# Patient Record
Sex: Female | Born: 1980 | Hispanic: No | Marital: Married | Smoking: Never smoker
Health system: Southern US, Community
[De-identification: ages and names within clinical notes are randomized; demographics above are authoritative.]

## PROBLEM LIST (undated history)

## (undated) DIAGNOSIS — Z8619 Personal history of other infectious and parasitic diseases: Secondary | ICD-10-CM

## (undated) DIAGNOSIS — Z789 Other specified health status: Secondary | ICD-10-CM

## (undated) HISTORY — DX: Personal history of other infectious and parasitic diseases: Z86.19

## (undated) HISTORY — DX: Other specified health status: Z78.9

## (undated) HISTORY — PX: NO PAST SURGERIES: SHX2092

---

## 2010-01-18 NOTE — L&D Delivery Note (Signed)
Delivery Note At 5:58 AM a viable female was delivered via Vaginal, Spontaneous Delivery (Presentation: ; Occiput Anterior).  APGAR:9 ,9 ; weight .   Placenta status: Intact, Spontaneous.  Cord: 3 vessels with the following complications: None.  Cord pH: NA  Anesthesia: Epidural  Episiotomy: None Lacerations: 2nd degree;Perineal Suture Repair: 3.0 vicryl Est. Blood Loss (mL): 300 ml  Mom to postpartum.  Baby to [facility] J. 12/23/2010, 6:11 AM

## 2010-05-26 LAB — ANTIBODY SCREEN: Antibody Screen: NEGATIVE

## 2010-05-26 LAB — GC/CHLAMYDIA PROBE AMP, GENITAL: Chlamydia: NEGATIVE

## 2010-05-26 LAB — RPR: RPR: NONREACTIVE

## 2010-05-26 LAB — RUBELLA ANTIBODY, IGM: Rubella: IMMUNE

## 2010-09-19 ENCOUNTER — Inpatient Hospital Stay (HOSPITAL_COMMUNITY): Admission: AD | Admit: 2010-09-19 | Payer: Self-pay | Source: Ambulatory Visit | Admitting: Obstetrics and Gynecology

## 2010-12-18 ENCOUNTER — Encounter (HOSPITAL_COMMUNITY): Payer: Self-pay | Admitting: *Deleted

## 2010-12-18 ENCOUNTER — Telehealth (HOSPITAL_COMMUNITY): Payer: Self-pay | Admitting: *Deleted

## 2010-12-18 NOTE — Telephone Encounter (Signed)
Preadmission screen  

## 2010-12-22 ENCOUNTER — Inpatient Hospital Stay (HOSPITAL_COMMUNITY)
Admission: RE | Admit: 2010-12-22 | Discharge: 2010-12-25 | DRG: 373 | Disposition: A | Discharge status: Home / Self Care | Payer: BC Managed Care – PPO | Source: Ambulatory Visit | Attending: Obstetrics and Gynecology | Admitting: Obstetrics and Gynecology

## 2010-12-22 ENCOUNTER — Other Ambulatory Visit (HOSPITAL_COMMUNITY): Payer: Self-pay | Admitting: Obstetrics and Gynecology

## 2010-12-22 ENCOUNTER — Inpatient Hospital Stay (HOSPITAL_COMMUNITY): Payer: BC Managed Care – PPO

## 2010-12-22 ENCOUNTER — Encounter (HOSPITAL_COMMUNITY): Payer: Self-pay | Admitting: Obstetrics and Gynecology

## 2010-12-22 LAB — CBC
HCT: 37 % (ref 36.0–46.0)
Hemoglobin: 12.2 g/dL (ref 12.0–15.0)
MCH: 29 pg (ref 26.0–34.0)
MCHC: 33 g/dL (ref 30.0–36.0)
MCV: 88.1 fL (ref 78.0–100.0)
RBC: 4.2 MIL/uL (ref 3.87–5.11)

## 2010-12-22 MED ORDER — LIDOCAINE HCL (PF) 1 % IJ SOLN: 30.0000 mL | INTRAMUSCULAR | Status: DC | PRN

## 2010-12-22 MED ORDER — OXYTOCIN 20 UNITS IN LACTATED RINGERS INFUSION - SIMPLE: 125.0000 mL/h | Freq: Once | INTRAVENOUS | Status: DC

## 2010-12-22 MED ORDER — OXYCODONE-ACETAMINOPHEN 5-325 MG PO TABS: 2.0000 | ORAL_TABLET | ORAL | Status: DC | PRN

## 2010-12-22 MED ORDER — OXYTOCIN 20 UNITS IN LACTATED RINGERS INFUSION - SIMPLE
125.0000 mL/h | INTRAVENOUS | Status: DC
  Administered 2010-12-22: 2 mL/h via INTRAVENOUS
  Filled 2010-12-22: qty 1000

## 2010-12-22 MED ORDER — ACETAMINOPHEN 325 MG PO TABS: 650.0000 mg | ORAL_TABLET | ORAL | Status: DC | PRN

## 2010-12-22 MED ORDER — CITRIC ACID-SODIUM CITRATE 334-500 MG/5ML PO SOLN: 30.0000 mL | ORAL | Status: DC | PRN

## 2010-12-22 MED ORDER — BUTORPHANOL TARTRATE 2 MG/ML IJ SOLN: 1.0000 mg | INTRAMUSCULAR | Status: DC | PRN

## 2010-12-22 MED ORDER — LACTATED RINGERS IV SOLN: INTRAVENOUS | Status: DC

## 2010-12-22 MED ORDER — IBUPROFEN 600 MG PO TABS
600.0000 mg | ORAL_TABLET | Freq: Four times a day (QID) | ORAL | Status: DC | PRN
  Filled 2010-12-22: qty 1

## 2010-12-22 MED ORDER — ONDANSETRON HCL 4 MG/2ML IJ SOLN: 4.0000 mg | Freq: Four times a day (QID) | INTRAMUSCULAR | Status: DC | PRN

## 2010-12-22 MED ORDER — ZOLPIDEM TARTRATE 10 MG PO TABS: 10.0000 mg | ORAL_TABLET | Freq: Every evening | ORAL | Status: DC | PRN

## 2010-12-22 MED ORDER — FLEET ENEMA 7-19 GM/118ML RE ENEM: 1.0000 | ENEMA | RECTAL | Status: DC | PRN

## 2010-12-22 MED ORDER — LACTATED RINGERS IV SOLN: 500.0000 mL | INTRAVENOUS | Status: DC | PRN

## 2010-12-22 MED ORDER — TERBUTALINE SULFATE 1 MG/ML IJ SOLN: 0.2500 mg | Freq: Once | INTRAMUSCULAR | Status: AC | PRN

## 2010-12-22 MED ORDER — OXYTOCIN BOLUS FROM INFUSION
500.0000 mL | Freq: Once | INTRAVENOUS | Status: DC
  Filled 2010-12-22: qty 500

## 2010-12-22 NOTE — Progress Notes (Signed)
Pt taken down to [place] by RN

## 2010-12-22 NOTE — H&P (Signed)
Joanne Scott is an 30 y.o. female.at 39 weeks for elective induction.  Prenatal course has been uncomplicated.  Prior pregnancy complicated by 25 week abruption and normal vagina deliver of 1 lb 13 oz female.  Two subsequent pregnancies were delivered at term following induction.  The patient has requested induction again and understands risks, possible complications including possible Cesarean delivery.  Informed consent has been given.  Pertinent Gynecological History: Menses: no Bleeding: no Contraception: none DES exposure: denies Blood transfusions: none Sexually transmitted diseases: no past history Previous GYN Procedures: no  Last mammogram: na Date: na Last pap: normal Date: 04/27/10 OB History: G5, P2 ,PT 1, A1, L 3   Menstrual History: Menarche age: Unknown No LMP recorded. Patient is pregnant.    Past Medical History  Diagnosis Date  . History of chicken pox   . No pertinent past medical history     Past Surgical History  Procedure Date  . No past surgeries     Family History  Problem Relation Age of Onset  . Hypertension Mother   . Heart disease Father   . Anesthesia problems Neg Hx   . Hypotension Neg Hx   . Malignant hyperthermia Neg Hx   . Pseudochol deficiency Neg Hx     Social History:  reports that she has never smoked. She has never used smokeless tobacco. She reports that she does not drink alcohol or use illicit drugs.  Allergies: No Known Allergies   (Not in a hospital admission)  Review of Systems  Constitutional: Negative.   HENT: Negative.   Eyes: Negative.   Respiratory: Negative.   Cardiovascular: Negative.   Gastrointestinal: Negative.   Genitourinary: Negative.   Musculoskeletal: Negative.   Skin: Negative.   Neurological: Negative.   Endo/Heme/Allergies: Negative.   Psychiatric/Behavioral: Negative.     There were no vitals taken for this visit. Physical Exam  Constitutional: She is oriented to person, place, and time. She  appears well-developed and well-nourished.  HENT:  Head: Normocephalic and atraumatic.  Mouth/Throat: No oropharyngeal exudate.  Eyes: Conjunctivae and EOM are normal. Right eye exhibits no discharge. Left eye exhibits no discharge. No scleral icterus.  Neck: Normal range of motion. No JVD present. No tracheal deviation present. No thyromegaly present.  Cardiovascular: Normal rate, regular rhythm, normal heart sounds and intact distal pulses.  Exam reveals no gallop and no friction rub.   No murmur heard. Respiratory: Effort normal and breath sounds normal. No respiratory distress. She has no wheezes. She has no rales. She exhibits no tenderness.  GI: Soft. Bowel sounds are normal. She exhibits no distension and no mass. There is no tenderness. There is no rebound and no guarding.  Genitourinary: Vagina normal. Guaiac negative stool.       Uterus gravid term  Musculoskeletal: Normal range of motion. She exhibits no edema and no tenderness.  Lymphadenopathy:    She has no cervical adenopathy.  Neurological: She is alert and oriented to person, place, and time. She displays normal reflexes. No cranial nerve deficit. She exhibits normal muscle tone. Coordination normal.  Skin: Skin is warm and dry. No rash noted. No erythema. No pallor.  Psychiatric: She has a normal mood and affect. Her behavior is normal. Judgment and thought content normal.    No results found for this or any previous visit (from the past 24 hour(s)).  No results found.  Assessment/Plan: 39 weeks, elective induction Pitocin  Joanne Scott 12/22/2010, 8:40 PM

## 2010-12-22 NOTE — Progress Notes (Signed)
Pt returned to room from [place] by tech

## 2010-12-23 ENCOUNTER — Inpatient Hospital Stay (HOSPITAL_COMMUNITY): Payer: BC Managed Care – PPO | Admitting: Anesthesiology

## 2010-12-23 ENCOUNTER — Encounter (HOSPITAL_COMMUNITY): Payer: Self-pay | Admitting: Anesthesiology

## 2010-12-23 ENCOUNTER — Encounter (HOSPITAL_COMMUNITY): Payer: Self-pay

## 2010-12-23 LAB — RPR: RPR Ser Ql: NONREACTIVE

## 2010-12-23 MED ORDER — PHENYLEPHRINE 40 MCG/ML (10ML) SYRINGE FOR IV PUSH (FOR BLOOD PRESSURE SUPPORT): 80.0000 ug | PREFILLED_SYRINGE | INTRAVENOUS | Status: DC | PRN

## 2010-12-23 MED ORDER — DIPHENHYDRAMINE HCL 50 MG/ML IJ SOLN: 12.5000 mg | INTRAMUSCULAR | Status: DC | PRN

## 2010-12-23 MED ORDER — LACTATED RINGERS IV SOLN: 500.0000 mL | Freq: Once | INTRAVENOUS | Status: DC

## 2010-12-23 MED ORDER — ONDANSETRON HCL 4 MG PO TABS: 4.0000 mg | ORAL_TABLET | ORAL | Status: DC | PRN

## 2010-12-23 MED ORDER — BENZOCAINE-MENTHOL 20-0.5 % EX AERO: 1.0000 "application " | INHALATION_SPRAY | CUTANEOUS | Status: DC | PRN

## 2010-12-23 MED ORDER — FENTANYL 2.5 MCG/ML BUPIVACAINE 1/10 % EPIDURAL INFUSION (WH - ANES)
14.0000 mL/h | INTRAMUSCULAR | Status: DC
  Administered 2010-12-23 (×2): 14 mL/h via EPIDURAL
  Filled 2010-12-23 (×2): qty 60

## 2010-12-23 MED ORDER — PSEUDOEPHEDRINE HCL 30 MG PO TABS
60.0000 mg | ORAL_TABLET | Freq: Four times a day (QID) | ORAL | Status: DC | PRN
  Administered 2010-12-23 – 2010-12-24 (×3): 60 mg via ORAL
  Filled 2010-12-23 (×3): qty 2

## 2010-12-23 MED ORDER — GUAIFENESIN-DM 100-10 MG/5ML PO SYRP
5.0000 mL | ORAL_SOLUTION | ORAL | Status: DC | PRN
  Administered 2010-12-23 – 2010-12-24 (×2): 5 mL via ORAL
  Filled 2010-12-23 (×2): qty 5

## 2010-12-23 MED ORDER — LANOLIN HYDROUS EX OINT: TOPICAL_OINTMENT | CUTANEOUS | Status: DC | PRN

## 2010-12-23 MED ORDER — ONDANSETRON HCL 4 MG/2ML IJ SOLN: 4.0000 mg | INTRAMUSCULAR | Status: DC | PRN

## 2010-12-23 MED ORDER — DIBUCAINE 1 % RE OINT: 1.0000 "application " | TOPICAL_OINTMENT | RECTAL | Status: DC | PRN

## 2010-12-23 MED ORDER — METHYLERGONOVINE MALEATE 0.2 MG PO TABS
0.2000 mg | ORAL_TABLET | ORAL | Status: DC | PRN
Start: 2010-12-23 — End: 2010-12-25

## 2010-12-23 MED ORDER — OXYCODONE-ACETAMINOPHEN 5-325 MG PO TABS
1.0000 | ORAL_TABLET | ORAL | Status: DC | PRN
  Administered 2010-12-23: 1 via ORAL
  Filled 2010-12-23: qty 1

## 2010-12-23 MED ORDER — FERROUS SULFATE 325 (65 FE) MG PO TABS
325.0000 mg | ORAL_TABLET | Freq: Two times a day (BID) | ORAL | Status: DC
  Administered 2010-12-24 – 2010-12-25 (×3): 325 mg via ORAL
  Filled 2010-12-23 (×3): qty 1

## 2010-12-23 MED ORDER — ZOLPIDEM TARTRATE 5 MG PO TABS: 5.0000 mg | ORAL_TABLET | Freq: Every evening | ORAL | Status: DC | PRN

## 2010-12-23 MED ORDER — EPHEDRINE 5 MG/ML INJ: 10.0000 mg | INTRAVENOUS | Status: DC | PRN

## 2010-12-23 MED ORDER — SENNOSIDES-DOCUSATE SODIUM 8.6-50 MG PO TABS
2.0000 | ORAL_TABLET | Freq: Every day | ORAL | Status: DC
  Administered 2010-12-23 – 2010-12-24 (×2): 2 via ORAL

## 2010-12-23 MED ORDER — METHYLERGONOVINE MALEATE 0.2 MG/ML IJ SOLN: 0.2000 mg | INTRAMUSCULAR | Status: DC | PRN

## 2010-12-23 MED ORDER — PHENYLEPHRINE 40 MCG/ML (10ML) SYRINGE FOR IV PUSH (FOR BLOOD PRESSURE SUPPORT)
80.0000 ug | PREFILLED_SYRINGE | INTRAVENOUS | Status: DC | PRN
  Filled 2010-12-23: qty 5

## 2010-12-23 MED ORDER — WITCH HAZEL-GLYCERIN EX PADS: 1.0000 "application " | MEDICATED_PAD | CUTANEOUS | Status: DC | PRN

## 2010-12-23 MED ORDER — TETANUS-DIPHTH-ACELL PERTUSSIS 5-2.5-18.5 LF-MCG/0.5 IM SUSP
0.5000 mL | Freq: Once | INTRAMUSCULAR | Status: AC
  Administered 2010-12-24: 0.5 mL via INTRAMUSCULAR
  Filled 2010-12-23: qty 0.5

## 2010-12-23 MED ORDER — LIDOCAINE HCL 1.5 % IJ SOLN
INTRAMUSCULAR | Status: DC | PRN
  Administered 2010-12-23: 2 mL via INTRADERMAL
  Administered 2010-12-23 (×2): 5 mL via INTRADERMAL

## 2010-12-23 MED ORDER — SIMETHICONE 80 MG PO CHEW: 80.0000 mg | CHEWABLE_TABLET | ORAL | Status: DC | PRN

## 2010-12-23 MED ORDER — EPHEDRINE 5 MG/ML INJ
10.0000 mg | INTRAVENOUS | Status: DC | PRN
  Filled 2010-12-23: qty 4

## 2010-12-23 MED ORDER — PRENATAL PLUS 27-1 MG PO TABS
1.0000 | ORAL_TABLET | Freq: Every day | ORAL | Status: DC
  Administered 2010-12-24 – 2010-12-25 (×2): 1 via ORAL
  Filled 2010-12-23 (×2): qty 1

## 2010-12-23 MED ORDER — DIPHENHYDRAMINE HCL 25 MG PO CAPS: 25.0000 mg | ORAL_CAPSULE | Freq: Four times a day (QID) | ORAL | Status: DC | PRN

## 2010-12-23 MED ORDER — IBUPROFEN 600 MG PO TABS
600.0000 mg | ORAL_TABLET | Freq: Four times a day (QID) | ORAL | Status: DC
  Administered 2010-12-23 – 2010-12-25 (×9): 600 mg via ORAL
  Filled 2010-12-23 (×8): qty 1

## 2010-12-23 NOTE — Anesthesia Preprocedure Evaluation (Signed)
Anesthesia Evaluation  Patient identified by MRN, date of birth, ID band Patient awake    Reviewed: Allergy & Precautions, H&P , NPO status , Patient's Chart, lab work & pertinent test results, reviewed documented beta blocker date and time   History of Anesthesia Complications Negative for: history of anesthetic complications  Airway Mallampati: I TM Distance: >3 FB Neck ROM: full    Dental  (+) Teeth Intact   Pulmonary neg pulmonary ROS,  clear to auscultation        Cardiovascular neg cardio ROS regular Normal    Neuro/Psych Negative Neurological ROS  Negative Psych ROS   GI/Hepatic negative GI ROS, Neg liver ROS,   Endo/Other  Negative Endocrine ROS  Renal/GU negative Renal ROS  Genitourinary negative   Musculoskeletal   Abdominal   Peds  Hematology negative hematology ROS (+)   Anesthesia Other Findings   Reproductive/Obstetrics (+) Pregnancy                           Anesthesia Physical Anesthesia Plan  ASA: II  Anesthesia Plan: Epidural   Post-op Pain Management:    Induction:   Airway Management Planned:   Additional Equipment:   Intra-op Plan:   Post-operative Plan:   Informed Consent: I have reviewed the patients History and Physical, chart, labs and discussed the procedure including the risks, benefits and alternatives for the proposed anesthesia with the patient or authorized representative who has indicated his/her understanding and acceptance.     Plan Discussed with:   Anesthesia Plan Comments:         Anesthesia Quick Evaluation

## 2010-12-23 NOTE — Anesthesia Procedure Notes (Signed)
Epidural Patient location during procedure: OB Start time: 12/23/2010 1:00 AM Reason for block: procedure for pain  Staffing Performed by: anesthesiologist   Preanesthetic Checklist Completed: patient identified, site marked, surgical consent, pre-op evaluation, timeout performed, IV checked, risks and benefits discussed and monitors and equipment checked  Epidural Patient position: sitting Prep: site prepped and draped and DuraPrep Patient monitoring: continuous pulse ox and blood pressure Approach: midline Injection technique: LOR air  Needle:  Needle type: Tuohy  Needle gauge: 17 G Needle length: 9 cm Needle insertion depth: 5 cm cm Catheter type: closed end flexible Catheter size: 19 Gauge Catheter at skin depth: 10 cm Test dose: negative  Assessment Events: blood not aspirated, injection not painful, no injection resistance, negative IV test and no paresthesia  Additional Notes Discussed risk of headache, infection, bleeding, nerve injury and failed or incomplete block.  Patient voices understanding and wishes to proceed.

## 2010-12-24 LAB — CBC
Hemoglobin: 11.1 g/dL — ABNORMAL LOW (ref 12.0–15.0)
MCH: 28.8 pg (ref 26.0–34.0)
Platelets: 147 10*3/uL — ABNORMAL LOW (ref 150–400)
RBC: 3.86 MIL/uL — ABNORMAL LOW (ref 3.87–5.11)

## 2010-12-24 NOTE — Progress Notes (Signed)
Post Partum Day 1 Subjective: no complaints  Objective: Blood pressure 117/88, pulse 88, temperature 97.6 F (36.4 C), temperature source Oral, resp. rate 18, height 5\' 4"  (1.626 m), weight 168 lb (76.204 kg), SpO2 98.00%, unknown if currently breastfeeding.  Physical Exam:  General: alert, cooperative and no distress Lochia: appropriate Uterine Fundus: firm Episiotomy, laceration : no significant drainage DVT Evaluation: No evidence of DVT seen on physical exam.   Basename 12/24/10 0515 12/22/10 2110  HGB 11.1* 12.2  HCT 34.1* 37.0    Assessment/Plan: Plan for discharge tomorrow   LOS: 2 days   GREENE,ELEANOR E 12/24/2010, 12:39 PM

## 2010-12-24 NOTE — Anesthesia Postprocedure Evaluation (Signed)
  Anesthesia Post-op Note  Patient: Joanne Scott  Procedure(s) Performed: * No procedures listed *  Patient Location: Mother/Baby  Anesthesia Type: Epidural  Level of Consciousness: awake, alert  and oriented  Airway and Oxygen Therapy: Patient Spontanous Breathing  Post-op Pain: none  Post-op Assessment: Post-op Vital signs reviewed, Patient's Cardiovascular Status Stable, No headache, No backache, No residual numbness and No residual motor weakness  Post-op Vital Signs: Reviewed and stable  Complications: No apparent anesthesia complications

## 2010-12-25 NOTE — Progress Notes (Signed)
Post Partum Day 2  Subjective: no complaints  Objective: Blood pressure 120/81, pulse 92, temperature 98.3 F (36.8 C), temperature source Oral, resp. rate 18, height 5\' 4"  (1.626 m), weight 168 lb (76.204 kg), SpO2 98.00%, unknown if currently breastfeeding.  Physical Exam:  General: alert, cooperative and no distress Lochia: appropriate Uterine Fundus: firm Episiotomy, laceration : no significant drainage DVT Evaluation: No evidence of DVT seen on physical exam.   Basename 12/24/10 0515 12/22/10 2110  HGB 11.1* 12.2  HCT 34.1* 37.0    Assessment/Plan: Discharge home   LOS: 3 days   Deatra Mcmahen E 12/25/2010, 12:07 PM

## 2010-12-25 NOTE — Discharge Summary (Signed)
Obstetric Discharge Summary Reason for Admission: induction of labor Prenatal Procedures: none Intrapartum Procedures: spontaneous vaginal delivery Postpartum Procedures: none Complications-Operative and Postpartum: none  Hemoglobin  Date Value Range Status  12/24/2010 11.1* 12.0-15.0 (g/dL) Final     HCT  Date Value Range Status  12/24/2010 34.1* 36.0-46.0 (%) Final    Discharge Diagnoses: Term Pregnancy-delivered  Discharge Information: Date: 12/25/2010 Activity: pelvic rest Diet: routine Medications: PNV and Percocet Condition: stable Instructions: refer to practice specific booklet Discharge to: home   Newborn Data: Live born  Information for the patient's newborn:  Antonetta, Clanton [[id number]]  female ; APGAR , ; weight ;  Home with mother.  Emiel Kielty E 12/25/2010, 12:09 PM

## 2013-11-19 ENCOUNTER — Encounter (HOSPITAL_COMMUNITY): Payer: Self-pay

## 2018-12-18 ENCOUNTER — Emergency Department (HOSPITAL_BASED_OUTPATIENT_CLINIC_OR_DEPARTMENT_OTHER): Payer: Medicaid Other

## 2018-12-18 ENCOUNTER — Encounter (HOSPITAL_BASED_OUTPATIENT_CLINIC_OR_DEPARTMENT_OTHER): Payer: Self-pay | Admitting: *Deleted

## 2018-12-18 ENCOUNTER — Emergency Department (HOSPITAL_BASED_OUTPATIENT_CLINIC_OR_DEPARTMENT_OTHER)
Admission: EM | Admit: 2018-12-18 | Discharge: 2018-12-18 | Disposition: A | Discharge status: Home / Self Care | Payer: Medicaid Other | Attending: Emergency Medicine | Admitting: Emergency Medicine

## 2018-12-18 ENCOUNTER — Other Ambulatory Visit: Payer: Self-pay

## 2018-12-18 DIAGNOSIS — S022XXA Fracture of nasal bones, initial encounter for closed fracture: Secondary | ICD-10-CM | POA: Diagnosis not present

## 2018-12-18 DIAGNOSIS — G44309 Post-traumatic headache, unspecified, not intractable: Secondary | ICD-10-CM | POA: Diagnosis not present

## 2018-12-18 DIAGNOSIS — G44319 Acute post-traumatic headache, not intractable: Secondary | ICD-10-CM

## 2018-12-18 DIAGNOSIS — R160 Hepatomegaly, not elsewhere classified: Secondary | ICD-10-CM

## 2018-12-18 DIAGNOSIS — Y92410 Unspecified street and highway as the place of occurrence of the external cause: Secondary | ICD-10-CM | POA: Diagnosis not present

## 2018-12-18 DIAGNOSIS — K7689 Other specified diseases of liver: Secondary | ICD-10-CM | POA: Insufficient documentation

## 2018-12-18 DIAGNOSIS — M542 Cervicalgia: Secondary | ICD-10-CM | POA: Diagnosis not present

## 2018-12-18 DIAGNOSIS — S0992XA Unspecified injury of nose, initial encounter: Secondary | ICD-10-CM | POA: Diagnosis present

## 2018-12-18 DIAGNOSIS — M79604 Pain in right leg: Secondary | ICD-10-CM | POA: Diagnosis not present

## 2018-12-18 DIAGNOSIS — N63 Unspecified lump in unspecified breast: Secondary | ICD-10-CM

## 2018-12-18 DIAGNOSIS — Y999 Unspecified external cause status: Secondary | ICD-10-CM | POA: Diagnosis not present

## 2018-12-18 DIAGNOSIS — R0789 Other chest pain: Secondary | ICD-10-CM | POA: Diagnosis not present

## 2018-12-18 DIAGNOSIS — N6320 Unspecified lump in the left breast, unspecified quadrant: Secondary | ICD-10-CM | POA: Diagnosis not present

## 2018-12-18 DIAGNOSIS — Y9389 Activity, other specified: Secondary | ICD-10-CM | POA: Insufficient documentation

## 2018-12-18 LAB — CBC WITH DIFFERENTIAL/PLATELET
Abs Immature Granulocytes: 0.02 10*3/uL (ref 0.00–0.07)
Basophils Absolute: 0.1 10*3/uL (ref 0.0–0.1)
Basophils Relative: 1 %
Eosinophils Absolute: 0.1 10*3/uL (ref 0.0–0.5)
Eosinophils Relative: 1 %
HCT: 32.9 % — ABNORMAL LOW (ref 36.0–46.0)
Hemoglobin: 9.5 g/dL — ABNORMAL LOW (ref 12.0–15.0)
Immature Granulocytes: 0 %
Lymphocytes Relative: 25 %
Lymphs Abs: 2.4 10*3/uL (ref 0.7–4.0)
MCH: 20.3 pg — ABNORMAL LOW (ref 26.0–34.0)
MCHC: 28.9 g/dL — ABNORMAL LOW (ref 30.0–36.0)
MCV: 70.1 fL — ABNORMAL LOW (ref 80.0–100.0)
Monocytes Absolute: 0.8 10*3/uL (ref 0.1–1.0)
Monocytes Relative: 8 %
Neutro Abs: 6.1 10*3/uL (ref 1.7–7.7)
Neutrophils Relative %: 65 %
Platelets: 253 10*3/uL (ref 150–400)
RBC: 4.69 MIL/uL (ref 3.87–5.11)
RDW: 19 % — ABNORMAL HIGH (ref 11.5–15.5)
WBC: 9.4 10*3/uL (ref 4.0–10.5)
nRBC: 0 % (ref 0.0–0.2)

## 2018-12-18 LAB — COMPREHENSIVE METABOLIC PANEL
ALT: 14 U/L (ref 0–44)
AST: 19 U/L (ref 15–41)
Albumin: 4.1 g/dL (ref 3.5–5.0)
Alkaline Phosphatase: 44 U/L (ref 38–126)
Anion gap: 6 (ref 5–15)
BUN: 12 mg/dL (ref 6–20)
CO2: 22 mmol/L (ref 22–32)
Calcium: 9 mg/dL (ref 8.9–10.3)
Chloride: 110 mmol/L (ref 98–111)
Creatinine, Ser: 0.61 mg/dL (ref 0.44–1.00)
GFR calc Af Amer: >60 mL/min (ref >60)
GFR calc non Af Amer: >60 mL/min (ref >60)
Glucose, Bld: 99 mg/dL (ref 70–99)
Potassium: 3.7 mmol/L (ref 3.5–5.1)
Sodium: 138 mmol/L (ref 135–145)
Total Bilirubin: 0.4 mg/dL (ref 0.3–1.2)
Total Protein: 7.8 g/dL (ref 6.5–8.1)

## 2018-12-18 LAB — PREGNANCY, URINE: Preg Test, Ur: NEGATIVE

## 2018-12-18 MED ORDER — IOHEXOL 300 MG/ML  SOLN
100.0000 mL | Freq: Once | INTRAMUSCULAR | Status: AC | PRN
  Administered 2018-12-18: 100 mL via INTRAVENOUS

## 2018-12-18 MED ORDER — OXYCODONE-ACETAMINOPHEN 5-325 MG PO TABS
1.0000 | ORAL_TABLET | Freq: Once | ORAL | Status: AC
  Administered 2018-12-18: 1 via ORAL
  Filled 2018-12-18: qty 1

## 2018-12-18 MED ORDER — KETOROLAC TROMETHAMINE 15 MG/ML IJ SOLN
15.0000 mg | Freq: Once | INTRAMUSCULAR | Status: AC
  Administered 2018-12-18: 15 mg via INTRAVENOUS
  Filled 2018-12-18: qty 1

## 2018-12-18 MED ORDER — CYCLOBENZAPRINE HCL 10 MG PO TABS: 10.0000 mg | ORAL_TABLET | Freq: Two times a day (BID) | ORAL | 0 refills | Status: AC | PRN
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
## 2018-12-18 NOTE — ED Notes (Signed)
Patient transported to CT 

## 2018-12-18 NOTE — ED Notes (Signed)
ED Provider at bedside discussing test results and dispo plan of care. 
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
## 2019-03-09 ENCOUNTER — Encounter (HOSPITAL_COMMUNITY): Payer: Self-pay
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
